# Patient Record
Sex: Male | Born: 1975 | Race: Black or African American | Hispanic: No | Marital: Single | Smoking: Current every day smoker
Health system: Southern US, Community
[De-identification: ages and names within clinical notes are randomized; demographics above are authoritative.]

## PROBLEM LIST (undated history)

## (undated) HISTORY — PX: OTHER SURGICAL HISTORY: SHX169

---

## 2007-01-22 ENCOUNTER — Emergency Department (HOSPITAL_COMMUNITY): Admission: EM | Admit: 2007-01-22 | Discharge: 2007-01-22 | Payer: Self-pay | Admitting: Emergency Medicine

## 2011-10-19 ENCOUNTER — Encounter (HOSPITAL_COMMUNITY): Payer: Self-pay | Admitting: *Deleted

## 2011-10-19 ENCOUNTER — Emergency Department (HOSPITAL_COMMUNITY): Payer: Self-pay

## 2011-10-19 ENCOUNTER — Emergency Department (HOSPITAL_COMMUNITY)
Admission: EM | Admit: 2011-10-19 | Discharge: 2011-10-19 | Disposition: A | Discharge status: Home Health | Payer: Self-pay | Attending: Emergency Medicine | Admitting: Emergency Medicine

## 2011-10-19 DIAGNOSIS — R509 Fever, unspecified: Secondary | ICD-10-CM

## 2011-10-19 DIAGNOSIS — B9789 Other viral agents as the cause of diseases classified elsewhere: Secondary | ICD-10-CM | POA: Insufficient documentation

## 2011-10-19 DIAGNOSIS — B349 Viral infection, unspecified: Secondary | ICD-10-CM

## 2011-10-19 DIAGNOSIS — R05 Cough: Secondary | ICD-10-CM

## 2011-10-19 DIAGNOSIS — R1110 Vomiting, unspecified: Secondary | ICD-10-CM

## 2011-10-19 DIAGNOSIS — R51 Headache: Secondary | ICD-10-CM

## 2011-10-19 DIAGNOSIS — R059 Cough, unspecified: Secondary | ICD-10-CM

## 2011-10-19 LAB — CBC
HCT: 44.5 % (ref 39.0–52.0)
MCV: 86.9 fL (ref 78.0–100.0)
RDW: 12.8 % (ref 11.5–15.5)
WBC: 4.8 10*3/uL (ref 4.0–10.5)

## 2011-10-19 LAB — COMPREHENSIVE METABOLIC PANEL
AST: 19 U/L (ref 0–37)
BUN: 10 mg/dL (ref 6–23)
CO2: 23 mEq/L (ref 19–32)
Calcium: 9.2 mg/dL (ref 8.4–10.5)
Creatinine, Ser: 0.99 mg/dL (ref 0.50–1.35)
GFR calc Af Amer: >90 mL/min (ref >90)
GFR calc non Af Amer: >90 mL/min (ref >90)
Glucose, Bld: 106 mg/dL — ABNORMAL HIGH (ref 70–99)

## 2011-10-19 LAB — URINALYSIS, ROUTINE W REFLEX MICROSCOPIC
Glucose, UA: 100 mg/dL — AB
Hgb urine dipstick: NEGATIVE
Ketones, ur: 15 mg/dL — AB
Protein, ur: 100 mg/dL — AB

## 2011-10-19 LAB — DIFFERENTIAL
Basophils Absolute: 0 10*3/uL (ref 0.0–0.1)
Eosinophils Relative: 0 % (ref 0–5)
Lymphocytes Relative: 15 % (ref 12–46)
Monocytes Absolute: 0.4 10*3/uL (ref 0.1–1.0)

## 2011-10-19 LAB — URINE MICROSCOPIC-ADD ON

## 2011-10-19 MED ORDER — ONDANSETRON HCL 4 MG PO TABS: 4.0000 mg | ORAL_TABLET | Freq: Four times a day (QID) | ORAL | Status: AC

## 2011-10-19 MED ORDER — SODIUM CHLORIDE 0.9 % IV BOLUS (SEPSIS)
1000.0000 mL | Freq: Once | INTRAVENOUS | Status: AC
  Administered 2011-10-19: 1000 mL via INTRAVENOUS

## 2011-10-19 MED ORDER — KETOROLAC TROMETHAMINE 30 MG/ML IJ SOLN
30.0000 mg | Freq: Once | INTRAMUSCULAR | Status: AC
  Administered 2011-10-19: 30 mg via INTRAVENOUS
  Filled 2011-10-19: qty 1

## 2011-10-19 MED ORDER — METOCLOPRAMIDE HCL 5 MG/ML IJ SOLN
10.0000 mg | Freq: Once | INTRAMUSCULAR | Status: AC
  Administered 2011-10-19: 10 mg via INTRAVENOUS
  Filled 2011-10-19: qty 2

## 2011-10-19 MED ORDER — ACETAMINOPHEN 325 MG PO TABS
650.0000 mg | ORAL_TABLET | Freq: Once | ORAL | Status: AC
  Administered 2011-10-19: 650 mg via ORAL
  Filled 2011-10-19: qty 2

## 2011-10-19 MED ORDER — DIPHENHYDRAMINE HCL 50 MG/ML IJ SOLN
25.0000 mg | Freq: Once | INTRAMUSCULAR | Status: AC
Start: 2011-10-19 — End: 2011-10-19
  Administered 2011-10-19: 25 mg via INTRAVENOUS
  Filled 2011-10-19: qty 1

## 2011-10-19 NOTE — ED Notes (Signed)
Pt given oral fluids per order. NAD noted at this time.

## 2011-10-19 NOTE — ED Notes (Signed)
Pt c/o headache for 7 days and gen flu sx for 4days. Pt States pain is in the back of head on left side. Pt has had n/v/d for 4 days and has not been keeping anything down. Pt states no medicine is helping his pain.

## 2011-10-19 NOTE — ED Notes (Signed)
Pt returned from xray

## 2011-10-19 NOTE — ED Notes (Signed)
Pt asleep and resting comfortably. 

## 2011-10-19 NOTE — ED Provider Notes (Signed)
History     CSN: [id number]  Arrival date & time 10/19/11  1544   First MD Initiated Contact with Patient 10/19/11 1731      Chief Complaint  Patient presents with  . Headache    (Consider location/radiation/quality/duration/timing/severity/associated sxs/prior treatment) HPI Patient presents with complaint of fever, vomiting and diarrhea as well as cough and low back pain. He has also had a headache associated with this illness. He denies any sore throat. He states he has been more tired than usual and has not been able to keep down oral medications. His temperature maximum at home was 103. He has tried multiple over-the-counter medications including Tylenol ibuprofen and cold medications without much relief. He denies any difficulty breathing. His emesis was nonbloody and nonbilious. There's been no blood in his stool. He has not had any episodes of fainting. He has no neck pain or stiffness. He's had no changes in his vision. There no associated systemic symptoms other than those already mentioned. There are no alleviating or modifying factors. He has no specific sick contacts. He's had no recent travel. History reviewed. No pertinent past medical history.  History reviewed. No pertinent past surgical history.  No family history on file.  History  Substance Use Topics  . Smoking status: Current Everyday Smoker  . Smokeless tobacco: Not on file  . Alcohol Use: Yes      Review of Systems ROS reviewed and otherwise negative except for mentioned in HPI  Allergies  Review of patient's allergies indicates no known allergies.  Home Medications   Current Outpatient Rx  Name Route Sig Dispense Refill  . ONDANSETRON HCL 4 MG PO TABS Oral Take 1 tablet (4 mg total) by mouth every 6 (six) hours. 12 tablet 0    BP 134/86  Pulse 98  Temp(Src) 101 F (38.3 C) (Oral)  Resp 20  SpO2 95% Vitals reviewed Physical Exam Physical Examination: General appearance - alert, well  appearing, and in no distress Mental status - alert, oriented to person, place, and time Neck- supple, no meningismus Eyes - pupils equal and reactive, extraocular eye movements intacts, no scleral icterus, no conjunctival injections Mouth - mucous membranes moist, pharynx normal without lesions Chest - clear to auscultation, no wheezes, rales or rhonchi, symmetric air entry Heart - normal rate, regular rhythm, normal S1, S2, no murmurs, rubs, clicks or gallops Abdomen - soft, nontender, nondistended, no masses or organomegaly Extremities - peripheral pulses normal, no pedal edema, no clubbing or cyanosis Skin - normal coloration and turgor, no rashes  ED Course  Procedures (including critical care time) 7:42 PM pt rechecked, he is feeling much improved, states he is hungry and thirsty and has more energy.  Headache is resolved as well.   Labs Reviewed  URINALYSIS, ROUTINE W REFLEX MICROSCOPIC - Abnormal; Notable for the following:    Glucose, UA 100 (*)    Ketones, ur 15 (*)    Protein, ur 100 (*)    All other components within normal limits  CBC - Abnormal; Notable for the following:    MCHC 36.4 (*)    All other components within normal limits  COMPREHENSIVE METABOLIC PANEL - Abnormal; Notable for the following:    Glucose, Bld 106 (*)    All other components within normal limits  URINE MICROSCOPIC-ADD ON - Abnormal; Notable for the following:    Squamous Epithelial / LPF FEW (*)    All other components within normal limits  DIFFERENTIAL  LAB REPORT - SCANNED  No results found.   1. Viral syndrome   2. Fever   3. Vomiting   4. Cough   5. Headache       MDM  Patient presenting with febrile illness including cough vomiting and diffuse body aches. In the emergency department he feels much improved after IV hydration and medication for headache and antibiotics. He is tolerating fluids without difficulty and actually states he feels much improved including feeling hungry.  I suspect a viral process. He had no meningismus to suggest meningitis. He was discharged with strict return precautions and he is agreeable with this plan.        Ethelda Chick, MD 10/24/11 [phone]

## 2011-10-19 NOTE — Discharge Instructions (Signed)
Return to the ED with any concerns including difficulty breathing, vomiting and not able to keep down liquids, decreased level of alertness or lethargy, or any other alarming symptoms.

## 2011-10-19 NOTE — ED Notes (Signed)
Pt d/c home in NAD. Pt voiced understanding of d/c instructions. Pt expressed gratitude to staff stating "I feel a lot better."

## 2011-10-19 NOTE — ED Notes (Signed)
Pt in xray

## 2011-10-19 NOTE — ED Notes (Signed)
The pt has been ill; for 7 days.  Headache elevated temp  Vomiting  Lower back pain.  His headache is no t letting up

## 2011-10-19 NOTE — ED Notes (Addendum)
Pt tolerating oral fluids and requesting and given another cup.

## 2012-05-28 ENCOUNTER — Encounter (HOSPITAL_COMMUNITY): Payer: Self-pay | Admitting: *Deleted

## 2012-05-28 ENCOUNTER — Emergency Department (HOSPITAL_COMMUNITY)
Admission: EM | Admit: 2012-05-28 | Discharge: 2012-05-29 | Disposition: A | Discharge status: Home / Self Care | Payer: Self-pay | Attending: Emergency Medicine | Admitting: Emergency Medicine

## 2012-05-28 DIAGNOSIS — IMO0002 Reserved for concepts with insufficient information to code with codable children: Secondary | ICD-10-CM

## 2012-05-28 DIAGNOSIS — F17200 Nicotine dependence, unspecified, uncomplicated: Secondary | ICD-10-CM | POA: Insufficient documentation

## 2012-05-28 DIAGNOSIS — S0180XA Unspecified open wound of other part of head, initial encounter: Secondary | ICD-10-CM | POA: Insufficient documentation

## 2012-05-28 DIAGNOSIS — W2203XA Walked into furniture, initial encounter: Secondary | ICD-10-CM | POA: Insufficient documentation

## 2012-05-28 NOTE — ED Notes (Signed)
Pt ambulatory to [facility], conscious alert and oriented. Pt sts he was moving furniture earlier today when an armoire fell onto his face. No LOC.

## 2012-05-29 MED ORDER — TETANUS-DIPHTH-ACELL PERTUSSIS 5-2.5-18.5 LF-MCG/0.5 IM SUSP
0.5000 mL | Freq: Once | INTRAMUSCULAR | Status: AC
  Administered 2012-05-29: 0.5 mL via INTRAMUSCULAR
  Filled 2012-05-29: qty 0.5

## 2012-05-29 NOTE — ED Provider Notes (Signed)
History     CSN: [id number]  Arrival date & time 05/28/12  2241   First MD Initiated Contact with Patient 05/28/12 2319      Chief Complaint  Patient presents with  . Facial Laceration    (Consider location/radiation/quality/duration/timing/severity/associated sxs/prior treatment) HPI Comments: Patient presents s/p injury to the head while moving furniture. Patient states that he was moving a couch when it slipped and caught the left corner of his head, resulting in a laceration. Patient states that there was a significant amount of bleeding. Unsure of last tetanus. Denies headache or LOC. Denies other injuries.   The history is provided by the patient. No language interpreter was used.    History reviewed. No pertinent past medical history.  Past Surgical History  Procedure Date  . No previous surgery     No family history on file.  History  Substance Use Topics  . Smoking status: Current Every Day Smoker  . Smokeless tobacco: Not on file  . Alcohol Use: Yes      Review of Systems  Skin: Positive for wound.  Neurological: Negative for headaches.    Allergies  Review of patient's allergies indicates no known allergies.  Home Medications  No current outpatient prescriptions on file.  BP 140/83  Pulse 60  Temp 98.6 F (37 C) (Oral)  Resp 16  Ht 5' 11.5" (1.816 m)  Wt 220 lb (99.791 kg)  BMI 30.26 kg/m2  SpO2 99%  Physical Exam  Nursing note and vitals reviewed. Constitutional: He appears well-developed and well-nourished. No distress.  HENT:  Head: Normocephalic.  Mouth/Throat: Oropharynx is clear and moist.  Eyes: Conjunctivae normal and EOM are normal.  Neck: Normal range of motion. Neck supple.  Cardiovascular: Normal rate, regular rhythm and normal heart sounds.   Pulmonary/Chest: Effort normal and breath sounds normal.  Abdominal: Soft. Bowel sounds are normal. There is no tenderness.  Neurological: He is alert.  Skin: Skin is warm and dry.         ED Course  Procedures (including critical care time)  Labs Reviewed - No data to display No results found.  LACERATION REPAIR Performed by: Pixie Casino Authorized by: Pixie Casino Consent: Verbal consent obtained. Risks and benefits: risks, benefits and alternatives were discussed Consent given by: patient Patient identity confirmed: provided demographic data Prepped and Draped in normal sterile fashion Wound explored  Laceration Location: above left eyebrow  Laceration Length: 1.5cm  No Foreign Bodies seen or palpated  Anesthesia: local infiltration  Local anesthetic: lidocaine 1% 2 epinephrine  Anesthetic total: 4ml  Irrigation method: syringe Amount of cleaning: standard  Skin closure: well approximated  Number of sutures: 4  Technique: simple interrupted  Patient tolerance: Patient tolerated the procedure well with no immediate complications.  1. Laceration       MDM  Patient presented with laceration above the left eyebrow while moving furniture. Laceration repaired without complication. Tetanus updated. Patient discharged with return precautions.        Pixie Casino, PA-C 05/29/12 1734

## 2012-05-29 NOTE — ED Notes (Signed)
Patient given discharge instructions, information, prescriptions, and diet order. Patient states that they adequately understand discharge information given and to return to ED if symptoms return or worsen.     

## 2012-06-01 NOTE — ED Provider Notes (Signed)
Medical screening examination/treatment/procedure(s) were performed by non-physician practitioner and as supervising physician I was immediately available for consultation/collaboration.  Jese Comella T Sue Mcalexander, MD 06/01/12 1021 

## 2012-06-03 ENCOUNTER — Emergency Department (HOSPITAL_COMMUNITY)
Admission: EM | Admit: 2012-06-03 | Discharge: 2012-06-03 | Disposition: A | Discharge status: Home / Self Care | Payer: Self-pay | Attending: Emergency Medicine | Admitting: Emergency Medicine

## 2012-06-03 ENCOUNTER — Encounter (HOSPITAL_COMMUNITY): Payer: Self-pay

## 2012-06-03 DIAGNOSIS — Z4802 Encounter for removal of sutures: Secondary | ICD-10-CM | POA: Insufficient documentation

## 2012-06-03 DIAGNOSIS — F17200 Nicotine dependence, unspecified, uncomplicated: Secondary | ICD-10-CM | POA: Insufficient documentation

## 2012-06-03 NOTE — ED Provider Notes (Signed)
History     CSN: [id number]  Arrival date & time 06/03/12  1152   First MD Initiated Contact with Patient 06/03/12 1241      Chief Complaint  Patient presents with  . Suture / Staple Removal    (Consider location/radiation/quality/duration/timing/severity/associated sxs/prior treatment) HPI  36 year old male presents for suture removal. Suture were placed on his left forehead above the eyebrow 6 days ago when he suffered a laceration from falling furniture.  Denies increasing pain, drainage, or swelling.  No other complaint  History reviewed. No pertinent past medical history.  Past Surgical History  Procedure Date  . No previous surgery     No family history on file.  History  Substance Use Topics  . Smoking status: Current Every Day Smoker  . Smokeless tobacco: Not on file  . Alcohol Use: Yes      Review of Systems  Constitutional: Negative for fever.  Skin: Positive for wound. Negative for rash.    Allergies  Review of patient's allergies indicates no known allergies.  Home Medications  No current outpatient prescriptions on file.  BP 136/72  Pulse 90  Temp 98 F (36.7 C) (Oral)  Resp 16  Ht 5\' 11"  (1.803 m)  Wt 220 lb (99.791 kg)  BMI 30.68 kg/m2  SpO2 100%  Physical Exam  Nursing note and vitals reviewed. Constitutional: He appears well-developed and well-nourished. No distress.  HENT:  Head: Normocephalic.       Laceration noted to L forehead above L eyebrow, no signs of infection, poor closure.  Eyes: Conjunctivae normal are normal.  Neck: Normal range of motion. Neck supple.  Neurological: He is alert.  Skin: Skin is warm. No rash noted.  Psychiatric: He has a normal mood and affect.    ED Course  Procedures (including critical care time)  Labs Reviewed - No data to display No results found.   No diagnosis found.  SUTURE REMOVAL Performed by: Fayrene Helper  Consent: Verbal consent obtained. Patient identity confirmed: provided  demographic data Time out: Immediately prior to procedure a "time out" was called to verify the correct patient, procedure, equipment, support staff and site/side marked as required.  Location details: L forehead  Wound Appearance: clean, poor closure  Sutures/Staples Removed: sutures  Facility: sutures placed in this facility Patient tolerance: Patient tolerated the procedure well with no immediate complications.   1. Visit for suture removal.   MDM  Pt here for sutures removal on L forehead. No signs of infection.  Wound is inadequately closed due to poor approximation of wound edge, causing epidermis to invert into wound.  Therefore, sterile tape were applied.  Care instruction given.     BP 136/72  Pulse 90  Temp 98 F (36.7 C) (Oral)  Resp 16  Ht 5\' 11"  (1.803 m)  Wt 220 lb (99.791 kg)  BMI 30.68 kg/m2  SpO2 100%      Fayrene Helper, PA-C 06/03/12 1307

## 2012-06-03 NOTE — ED Notes (Signed)
Pt presents with NAD_ here for removal of sutures above left eyebrow- no s/s of infection

## 2012-06-04 NOTE — ED Provider Notes (Signed)
Medical screening examination/treatment/procedure(s) were performed by non-physician practitioner and as supervising physician I was immediately available for consultation/collaboration.   Nature Kueker M Skyylar Kopf, DO 06/04/12 2028 

## 2013-03-31 IMAGING — CR DG CHEST 2V
2 series · 2 of 2 positions shown · non-contrast
Comparison: None.

CLINICAL DATA: Fever and cough.

CHEST - 2 VIEW

[w chest pa]
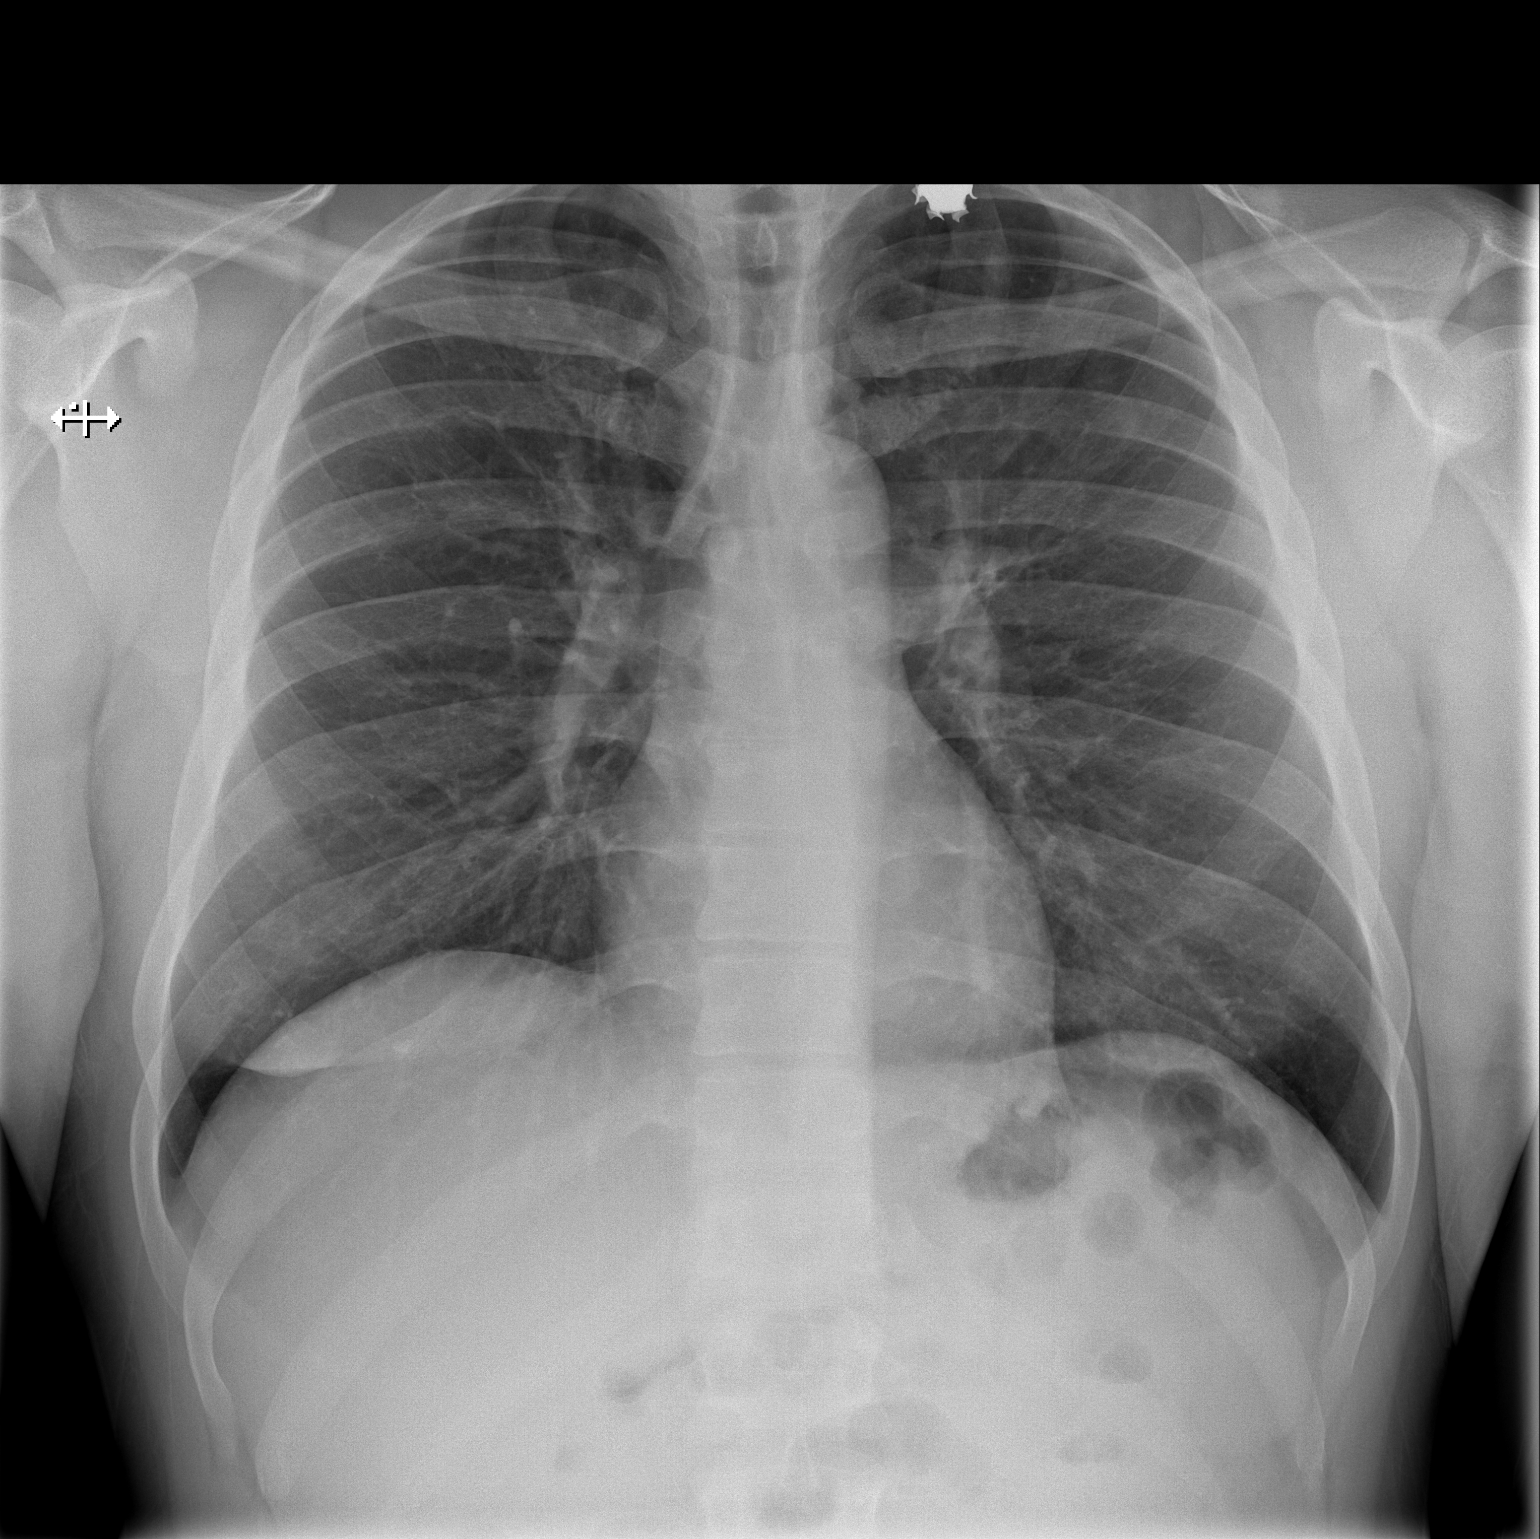

[w chest lat]
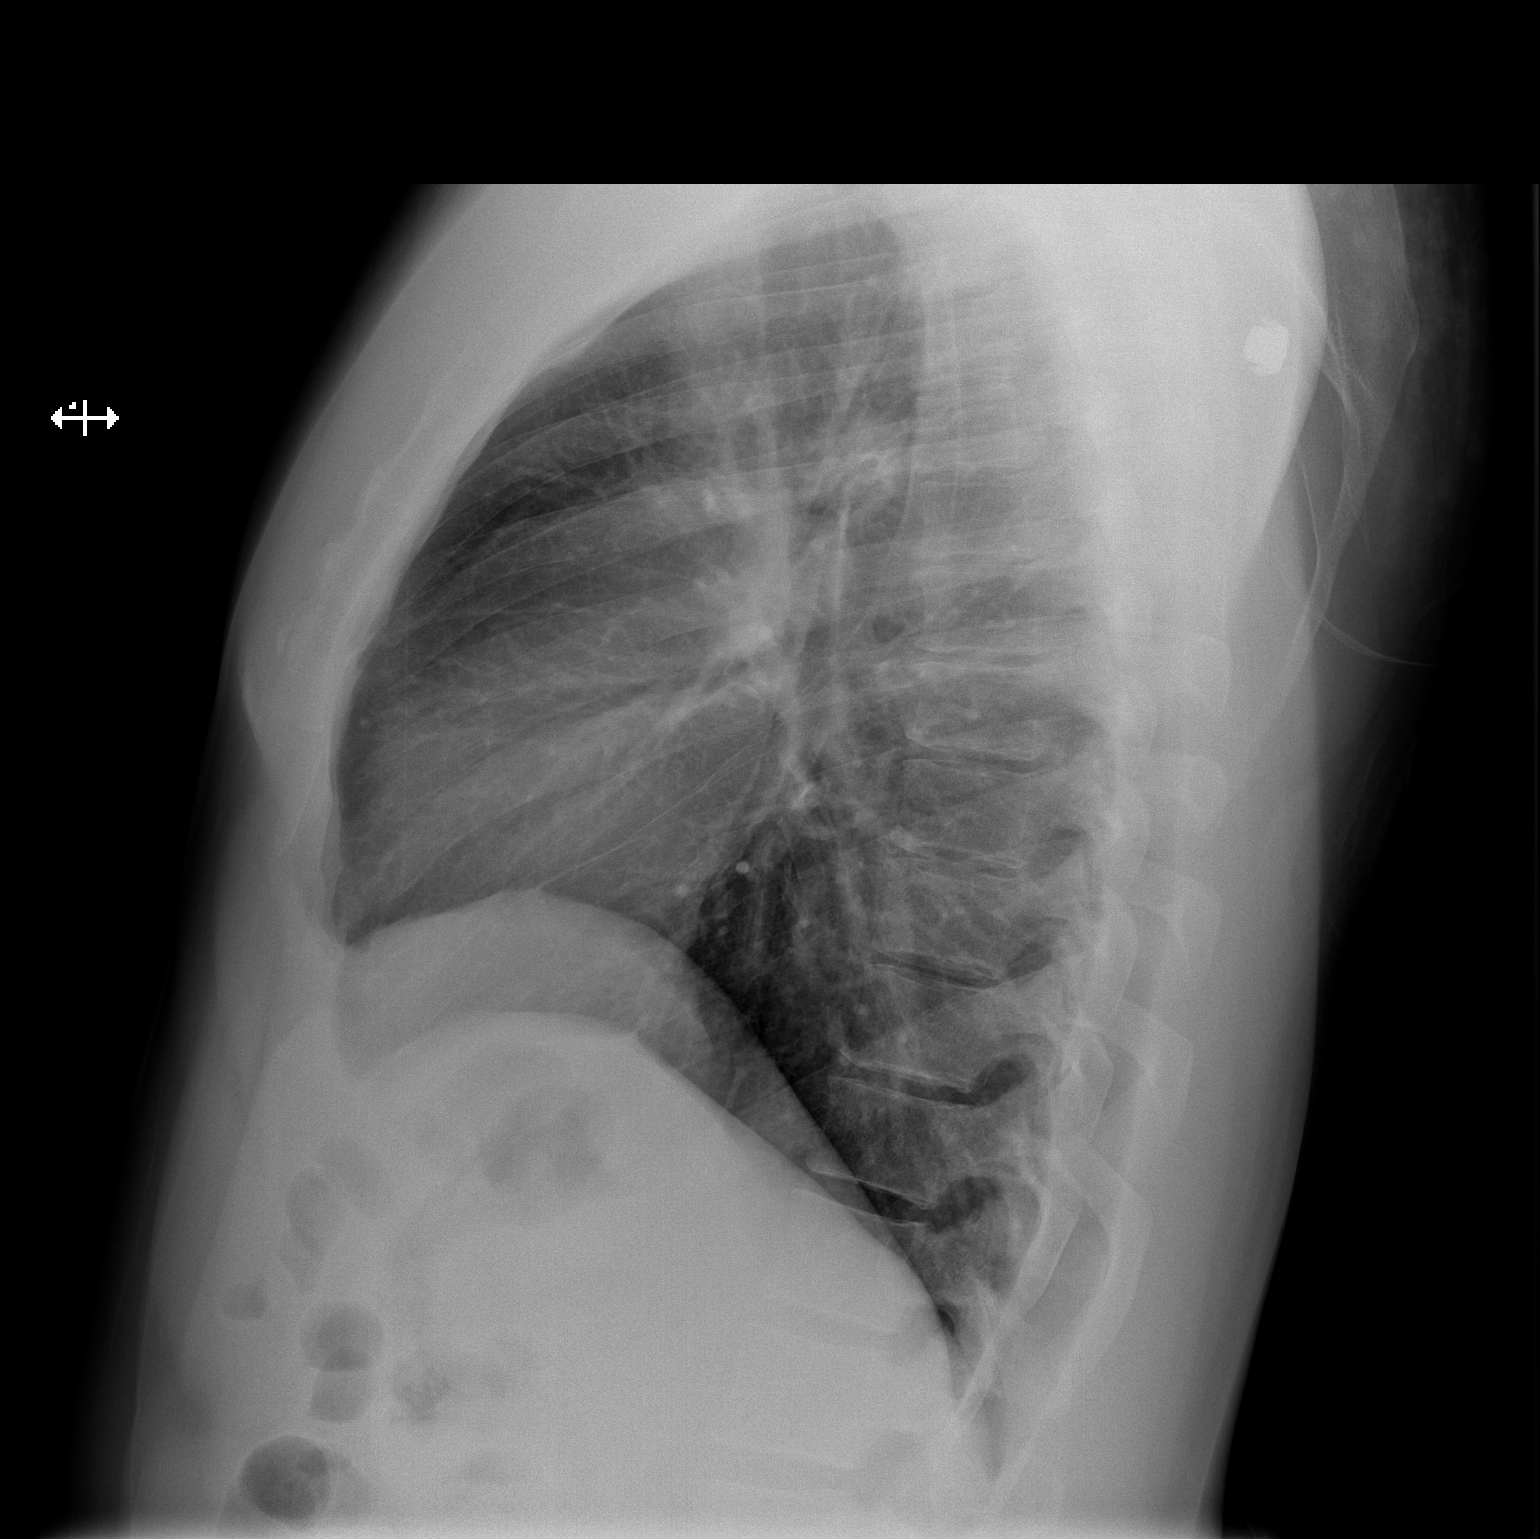

[2 of 2 positions shown; findings below may reference images not displayed]

FINDINGS: The lungs are clear without focal consolidation, edema,
effusion or pneumothorax.  Cardiopericardial silhouette is within
normal limits for size.  Imaged bony structures of the thorax are
intact.
IMPRESSION: Normal exam.

## 2013-11-24 ENCOUNTER — Encounter (HOSPITAL_COMMUNITY): Payer: Self-pay | Admitting: Emergency Medicine

## 2013-11-24 ENCOUNTER — Emergency Department (HOSPITAL_COMMUNITY)
Admission: EM | Admit: 2013-11-24 | Discharge: 2013-11-24 | Disposition: A | Discharge status: Home / Self Care | Payer: Self-pay | Attending: Emergency Medicine | Admitting: Emergency Medicine

## 2013-11-24 DIAGNOSIS — K0820 Unspecified atrophy of edentulous alveolar ridge: Secondary | ICD-10-CM | POA: Insufficient documentation

## 2013-11-24 DIAGNOSIS — F17200 Nicotine dependence, unspecified, uncomplicated: Secondary | ICD-10-CM | POA: Insufficient documentation

## 2013-11-24 DIAGNOSIS — K0889 Other specified disorders of teeth and supporting structures: Secondary | ICD-10-CM

## 2013-11-24 DIAGNOSIS — K029 Dental caries, unspecified: Secondary | ICD-10-CM | POA: Insufficient documentation

## 2013-11-24 DIAGNOSIS — R51 Headache: Secondary | ICD-10-CM | POA: Insufficient documentation

## 2013-11-24 DIAGNOSIS — K089 Disorder of teeth and supporting structures, unspecified: Secondary | ICD-10-CM | POA: Insufficient documentation

## 2013-11-24 MED ORDER — IBUPROFEN 800 MG PO TABS: 800.0000 mg | ORAL_TABLET | Freq: Three times a day (TID) | ORAL | Status: DC

## 2013-11-24 MED ORDER — HYDROCODONE-ACETAMINOPHEN 5-325 MG PO TABS: 1.0000 | ORAL_TABLET | ORAL | Status: DC | PRN

## 2013-11-24 MED ORDER — AMOXICILLIN 500 MG PO CAPS: 500.0000 mg | ORAL_CAPSULE | Freq: Three times a day (TID) | ORAL | Status: DC

## 2013-11-24 MED ORDER — OXYCODONE-ACETAMINOPHEN 5-325 MG PO TABS
1.0000 | ORAL_TABLET | Freq: Once | ORAL | Status: AC
  Administered 2013-11-24: 1 via ORAL
  Filled 2013-11-24: qty 1

## 2013-11-24 MED ORDER — IBUPROFEN 800 MG PO TABS
800.0000 mg | ORAL_TABLET | Freq: Once | ORAL | Status: AC
  Administered 2013-11-24: 800 mg via ORAL
  Filled 2013-11-24: qty 1

## 2013-11-24 NOTE — Progress Notes (Signed)
[facility] provided pt with dental resources, list of primary care resources, and a [facility] application to help patient establish primary care.

## 2013-11-24 NOTE — Discharge Instructions (Signed)
Ibuprofen for pain. norco for severe pain. Take amoxil until all gone for possible infection. Follow up with a dentist.    Dental Caries  Dental caries (also called tooth decay) is the most common oral disease. It can occur at any age, but is more common in children and young adults.  HOW DENTAL CARIES DEVELOPS  The process of decay begins when bacteria and foods (particularly sugars and starches) combine in your mouth to produce plaque. Plaque is a substance that sticks to the hard, outer surface of a tooth (enamel). The bacteria in plaque produce acids that attack enamel. These acids may also attack the root surface of a tooth (cementum) if it is exposed. Repeated attacks dissolve these surfaces and create holes in the tooth (cavities). If left untreated, the acids destroy the other layers of the tooth.  RISK FACTORS  Frequent sipping of sugary beverages.   Frequent snacking on sugary and starchy foods, especially those that easily get stuck in the teeth.   Poor oral hygiene.   Dry mouth.   Substance abuse such as methamphetamine abuse.   Broken or poor-fitting dental restorations.   Eating disorders.   Gastroesophageal reflux disease (GERD).   Certain radiation treatments to the head and neck. SYMPTOMS In the early stages of dental caries, symptoms are seldom present. Sometimes white, chalky areas may be seen on the enamel or other tooth layers. In later stages, symptoms may include:  Pits and holes on the enamel.  Toothache after sweet, hot, or cold foods or drinks are consumed.  Pain around the tooth.  Swelling around the tooth. DIAGNOSIS  Most of the time, dental caries is detected during a regular dental checkup. A diagnosis is made after a thorough medical and dental history is taken and the surfaces of your teeth are checked for signs of dental caries. Sometimes special instruments, such as lasers, are used to check for dental caries. Dental X-ray exams may be  taken so that areas not visible to the eye (such as between the contact areas of the teeth) can be checked for cavities.  TREATMENT  If dental caries is in its early stages, it may be reversed with a fluoride treatment or an application of a remineralizing agent at the dental office. Thorough brushing and flossing at home is needed to aid these treatments. If it is in its later stages, treatment depends on the location and extent of tooth destruction:   If a small area of the tooth has been destroyed, the destroyed area will be removed and cavities will be filled with a material such as gold, silver amalgam, or composite resin.   If a large area of the tooth has been destroyed, the destroyed area will be removed and a cap (crown) will be fitted over the remaining tooth structure.   If the center part of the tooth (pulp) is affected, a procedure called a root canal will be needed before a filling or crown can be placed.   If most of the tooth has been destroyed, the tooth may need to be pulled (extracted). HOME CARE INSTRUCTIONS You can prevent, stop, or reverse dental caries at home by practicing good oral hygiene. Good oral hygiene includes:  Thoroughly cleaning your teeth at least twice a day with a toothbrush and dental floss.   Using a fluoride toothpaste. A fluoride mouth rinse may also be used if recommended by your dentist or health care provider.   Restricting the amount of sugary and starchy foods and  sugary liquids you consume.   Avoiding frequent snacking on these foods and sipping of these liquids.   Keeping regular visits with a dentist for checkups and cleanings. PREVENTION   Practice good oral hygiene.  Consider a dental sealant. A dental sealant is a coating material that is applied by your dentist to the pits and grooves of teeth. The sealant prevents food from being trapped in them. It may protect the teeth for several years.  Ask about fluoride supplements if  you live in a community without fluorinated water or with water that has a low fluoride content. Use fluoride supplements as directed by your dentist or health care provider.  Allow fluoride varnish applications to teeth if directed by your dentist or health care provider. Document Released: 04/21/2002 Document Revised: 04/01/2013 Document Reviewed: 08/01/2012 [facility] Patient Information 2014 [employer] [place].

## 2013-11-24 NOTE — ED Notes (Signed)
Pt reports lower back right tooth pain with 2 broken teeth. Pt reports pain so bad "I could not sleep last night". Pain started yesterday and is 10/10.

## 2013-11-24 NOTE — ED Provider Notes (Signed)
CSN: [id number]     Arrival date & time 11/24/13  1042 History   First MD Initiated Contact with Patient 11/24/13 1049     No chief complaint on file.    (Consider location/radiation/quality/duration/timing/severity/associated sxs/prior Treatment) HPI Antonio Salas is a 38 y.o. male who presents to emergency department complaining of a toothache. Patient states that he has pain in his right lower first molar tooth. States pain radiates all over right jaw. He denies any facial swelling or swelling under the tongue. He denies any gum swelling. He states he is unable to eat. States he did not sleep last month because the pain. He did not take any medications for this. States he has a history of carries in that tooth, and it is broken off. Patient with prior upper teeth work and a partial, [place] States needs to have his lower teeth fixed now. He does not have dental insurance and does not have a dentist at this time. He denies any fever chills.  History reviewed. No pertinent past medical history. Past Surgical History  Procedure Laterality Date  . No previous surgery     No family history on file. History  Substance Use Topics  . Smoking status: Current Every Day Smoker  . Smokeless tobacco: Not on file  . Alcohol Use: Yes    Review of Systems  Constitutional: Negative for fever and chills.  HENT: Positive for dental problem. Negative for facial swelling.   Respiratory: Negative for cough, chest tightness and shortness of breath.   Cardiovascular: Negative for chest pain, palpitations and leg swelling.  Skin: Negative for rash.  Allergic/Immunologic: Negative for immunocompromised state.  Neurological: Positive for headaches. Negative for dizziness, weakness, light-headedness and numbness.      Allergies  Review of patient's allergies indicates no known allergies.  Home Medications   Prior to Admission medications   Not on File   BP 149/92  Pulse 79   Temp(Src) 98.6 F (37 C) (Oral)  Resp 16  SpO2 99% Physical Exam  Nursing note and vitals reviewed. Constitutional: He appears well-developed and well-nourished. No distress.  HENT:  Head: Normocephalic and atraumatic.  Edentulous upper front, partial and place, he removed partial, and gums appear normal. Right lower first molar with large cavity. No surrounding gum erythema or swelling. Tooth is tender to palpation. There is also a very large cavity in the left upper second molar. No signs of infection.  Eyes: Conjunctivae are normal.  Neck: Neck supple.  Cardiovascular: Normal rate, regular rhythm and normal heart sounds.   Pulmonary/Chest: Effort normal. No respiratory distress. He has no wheezes. He has no rales.  Musculoskeletal: He exhibits no edema.  Neurological: He is alert.  Skin: Skin is warm and dry.    ED Course  Procedures (including critical care time) Labs Review Labs Reviewed - No data to display  Imaging Review No results found.   EKG Interpretation None      MDM   Final diagnoses:  Dental cavity  Pain, dental    Patient with dental caries. No signs of obvious infection at this time. Will start on amoxicillin for possible early infection. Discussed need for followup with a dentist. Patient states he only went to a free clinic, and was told he needed to apply for orange card which he will do today. I also gave him flyer for a free clinic. Will give ibuprofen and Percocet for pain. Pt is non toxic otherwise, no signs of infection  or ludwig's angina.   Filed Vitals:   11/24/13 1051  BP: 149/92  Pulse: 79  Temp: 98.6 F (37 C)  Resp: 16      Eliyas Suddreth A Elese Rane, PA-C 11/24/13 1353

## 2013-11-27 NOTE — ED Provider Notes (Signed)
Medical screening examination/treatment/procedure(s) were performed by non-physician practitioner and as supervising physician I was immediately available for consultation/collaboration.   EKG Interpretation None        Kameran Lallier J. Urijah Raynor, MD 11/27/13 1701 

## 2014-10-10 ENCOUNTER — Emergency Department (HOSPITAL_COMMUNITY)
Admission: EM | Admit: 2014-10-10 | Discharge: 2014-10-11 | Disposition: A | Discharge status: Home / Self Care | Payer: Self-pay | Attending: Emergency Medicine | Admitting: Emergency Medicine

## 2014-10-10 ENCOUNTER — Encounter (HOSPITAL_COMMUNITY): Payer: Self-pay | Admitting: Emergency Medicine

## 2014-10-10 DIAGNOSIS — K644 Residual hemorrhoidal skin tags: Secondary | ICD-10-CM | POA: Insufficient documentation

## 2014-10-10 DIAGNOSIS — Z792 Long term (current) use of antibiotics: Secondary | ICD-10-CM | POA: Insufficient documentation

## 2014-10-10 DIAGNOSIS — K648 Other hemorrhoids: Secondary | ICD-10-CM | POA: Insufficient documentation

## 2014-10-10 DIAGNOSIS — Z720 Tobacco use: Secondary | ICD-10-CM | POA: Insufficient documentation

## 2014-10-10 DIAGNOSIS — Z791 Long term (current) use of non-steroidal anti-inflammatories (NSAID): Secondary | ICD-10-CM | POA: Insufficient documentation

## 2014-10-10 NOTE — ED Notes (Signed)
Pt states that he has had a 'knot' on his rectum for several days and noticed pain today. Some blood noted when wiping. Alert and oriented. Hx of hemmrrhoids.

## 2014-10-11 MED ORDER — HYDROCORTISONE ACE-PRAMOXINE 1-1 % RE FOAM: 1.0000 | Freq: Two times a day (BID) | RECTAL | Status: AC

## 2014-10-11 MED ORDER — POLYETHYLENE GLYCOL 3350 17 G PO PACK: 17.0000 g | PACK | Freq: Every day | ORAL | Status: AC

## 2014-10-11 NOTE — Discharge Instructions (Signed)

## 2014-10-11 NOTE — ED Provider Notes (Signed)
CSN: [id number]     Arrival date & time 10/10/14  2303 History   First MD Initiated Contact with Patient 10/11/14 0116     Chief Complaint  Patient presents with  . Rectal Pain     (Consider location/radiation/quality/duration/timing/severity/associated sxs/prior Treatment) HPI 39 year old male presents to the emergency department with complaint of pain in his rectum.  Patient has a history of hemorrhoids.  He noticed today that he was having more pain and some blood with wiping.  When applying Preparation H, patient noted a knot to his rectum.  No prior history of same.  No fevers or chills. History reviewed. No pertinent past medical history. Past Surgical History  Procedure Laterality Date  . No previous surgery     History reviewed. No pertinent family history. History  Substance Use Topics  . Smoking status: Current Every Day Smoker  . Smokeless tobacco: Not on file  . Alcohol Use: Yes    Review of Systems  See History of Present Illness; otherwise all other systems are reviewed and negative   Allergies  Review of patient's allergies indicates no known allergies.  Home Medications   Prior to Admission medications   Medication Sig Start Date End Date Taking? Authorizing Provider  phenylephrine-shark liver oil-mineral oil-petrolatum (PREPARATION H) 0.25-3-14-71.9 % rectal ointment Place 1 application rectally 2 (two) times daily as needed for hemorrhoids.   Yes Historical Provider, MD  amoxicillin (AMOXIL) 500 MG capsule Take 1 capsule (500 mg total) by mouth 3 (three) times daily. Patient not taking: Reported on 10/11/2014 11/24/13   Tatyana A Kirichenko, PA-C  HYDROcodone-acetaminophen (NORCO/VICODIN) 5-325 MG per tablet Take 1 tablet by mouth every 4 (four) hours as needed. Patient not taking: Reported on 10/11/2014 11/24/13   Tatyana A Kirichenko, PA-C  ibuprofen (ADVIL,MOTRIN) 800 MG tablet Take 1 tablet (800 mg total) by mouth 3 (three) times daily. Patient not taking:  Reported on 10/11/2014 11/24/13   Tatyana A Kirichenko, PA-C   BP 144/79 mmHg  Pulse 86  Temp(Src) 98 F (36.7 C) (Oral)  SpO2 98% Physical Exam  Constitutional: He is oriented to person, place, and time. He appears well-developed and well-nourished. No distress.  Abdominal: Soft. Bowel sounds are normal. He exhibits no distension and no mass. There is no tenderness. There is no rebound and no guarding.  Genitourinary:  Patient with both internal and external hemorrhoids that are swollen but not engorged.  They are tender to touch.  Both at 5:00 position.  They are not thrombosed.  Do not require I&D at this time  Musculoskeletal: Normal range of motion. He exhibits no edema or tenderness.  Neurological: He is alert and oriented to person, place, and time.  Skin: Skin is warm and dry. No rash noted. No erythema. No pallor.  Psychiatric: He has a normal mood and affect. His behavior is normal. Judgment and thought content normal.  Nursing note and vitals reviewed.   ED Course  Procedures (including critical care time) Labs Review Labs Reviewed - No data to display  Imaging Review No results found.   EKG Interpretation None      MDM   Final diagnoses:  Internal hemorrhoid  External hemorrhoid    39 year old male with internal and external hemorrhoids.  Plan for stool softener, proctofoam, follow-up with primary care doctor    Olivia Mackielga M Marqus Macphee, MD 10/11/14 0202

## 2015-10-01 ENCOUNTER — Encounter (HOSPITAL_COMMUNITY): Payer: Self-pay

## 2015-10-01 ENCOUNTER — Emergency Department (HOSPITAL_COMMUNITY)
Admission: EM | Admit: 2015-10-01 | Discharge: 2015-10-01 | Disposition: A | Discharge status: Home / Self Care | Payer: Self-pay | Attending: Emergency Medicine | Admitting: Emergency Medicine

## 2015-10-01 DIAGNOSIS — F17200 Nicotine dependence, unspecified, uncomplicated: Secondary | ICD-10-CM | POA: Insufficient documentation

## 2015-10-01 DIAGNOSIS — Z7952 Long term (current) use of systemic steroids: Secondary | ICD-10-CM | POA: Insufficient documentation

## 2015-10-01 DIAGNOSIS — L03011 Cellulitis of right finger: Secondary | ICD-10-CM | POA: Insufficient documentation

## 2015-10-01 MED ORDER — CEPHALEXIN 500 MG PO CAPS: 500.0000 mg | ORAL_CAPSULE | Freq: Four times a day (QID) | ORAL | Status: AC

## 2015-10-01 MED ORDER — ACETAMINOPHEN 325 MG PO TABS
650.0000 mg | ORAL_TABLET | Freq: Once | ORAL | Status: AC
  Administered 2015-10-01: 650 mg via ORAL
  Filled 2015-10-01: qty 2

## 2015-10-01 MED ORDER — HYDROCODONE-ACETAMINOPHEN 5-325 MG PO TABS: 1.0000 | ORAL_TABLET | Freq: Four times a day (QID) | ORAL | Status: AC | PRN

## 2015-10-01 MED ORDER — SULFAMETHOXAZOLE-TRIMETHOPRIM 800-160 MG PO TABS: 1.0000 | ORAL_TABLET | Freq: Two times a day (BID) | ORAL | Status: AC

## 2015-10-01 NOTE — Discharge Instructions (Signed)
Soak finger several times a day. Take both antibiotics as prescribed. Follow up as needed. Return if worsening.    Paronychia Paronychia is an infection of the skin that surrounds a nail. It usually affects the skin around a fingernail, but it may also occur near a toenail. It often causes pain and swelling around the nail. This condition may come on suddenly or develop over a longer period. In some cases, a collection of pus (abscess) can form near or under the nail. Usually, paronychia is not serious and it clears up with treatment. CAUSES This condition may be caused by bacteria or fungi. It is commonly caused by either Streptococcus or Staphylococcus bacteria. The bacteria or fungi often cause the infection by getting into the affected area through an opening in the skin, such as a cut or a hangnail. RISK FACTORS This condition is more likely to develop in:  People who get their hands wet often, such as those who work as Fish farm manager, bartenders, or nurses.  People who bite their fingernails or suck their thumbs.  People who trim their nails too short.  People who have hangnails or injured fingertips.  People who get manicures.  People who have diabetes. SYMPTOMS Symptoms of this condition include:  Redness and swelling of the skin near the nail.  Tenderness around the nail when you touch the area.  Pus-filled bumps under the cuticle. The cuticle is the skin at the base or sides of the nail.  Fluid or pus under the nail.  Throbbing pain in the area. DIAGNOSIS This condition is usually diagnosed with a physical exam. In some cases, a sample of pus may be taken from an abscess to be tested in a lab. This can help to determine what type of bacteria or fungi is causing the condition. TREATMENT Treatment for this condition depends on the cause and severity of the condition. If the condition is mild, it may clear up on its own in a few days. Your health care provider may recommend  soaking the affected area in warm water a few times a day. When treatment is needed, the options may include:  Antibiotic medicine, if the condition is caused by a bacterial infection.  Antifungal medicine, if the condition is caused by a fungal infection.  Incision and drainage, if an abscess is present. In this procedure, the health care provider will cut open the abscess so the pus can drain out. HOME CARE INSTRUCTIONS  Soak the affected area in warm water if directed to do so by your health care provider. You may be told to do this for 20 minutes, 2-3 times a day. Keep the area dry in between soakings.  Take medicines only as directed by your health care provider.  If you were prescribed an antibiotic medicine, finish all of it even if you start to feel better.  Keep the affected area clean.  Do not try to drain a fluid-filled bump yourself.  If you will be washing dishes or performing other tasks that require your hands to get wet, wear rubber gloves. You should also wear gloves if your hands might come in contact with irritating substances, such as cleaners or chemicals.  Follow your health care provider's instructions about:  Wound care.  Bandage (dressing) changes and removal. SEEK MEDICAL CARE IF:  Your symptoms get worse or do not improve with treatment.  You have a fever or chills.  You have redness spreading from the affected area.  You have continued or increased  fluid, blood, or pus coming from the affected area.  Your finger or knuckle becomes swollen or is difficult to move.   This information is not intended to replace advice given to you by your health care provider. Make sure you discuss any questions you have with your health care provider.   Document Released: 01/23/2001 Document Revised: 12/14/2014 Document Reviewed: 07/07/2014 Elsevier Interactive Patient Education [employer]

## 2015-10-01 NOTE — ED Notes (Signed)
Pt presents with c/o right middle finger pain and swelling that he noticed approx 3 days ago. Pt reports he has noticed some pus draining from the finger, reports that yesterday he took a safety pin, heated it up and tried to drain some of the pus.

## 2015-10-01 NOTE — ED Provider Notes (Signed)
CSN: [id number]     Arrival date & time 10/01/15  1426 History  By signing my name below, I, Antonio Salas, attest that this documentation has been prepared under the direction and in the presence of non-physician practitioner, Antonio Crumble, PA-C. Electronically Signed: Linna Salas, Scribe. 10/01/2015. 2:34 PM.    Chief Complaint  Patient presents with  . Hand Pain    The history is provided by the patient. No language interpreter was used.     HPI Comments: Antonio Salas is a 40 y.o. male with no pertinent PMHx who presents to the Emergency Department complaining of sudden onset, constant, throbbing, right middle finger pain beginning approximately three days ago. He endorses associated right middle finger swelling and reports that the pain and swelling are localized around his right middle fingernail. He states that he bites his fingernails and may have bitten his right middle fingernail back too far. Pt reports that he woke up three days ago with throbbing pain in his finger and took ibuprofen with no relief. He reports that he noticed a green color around his fingernail the following day and took more ibuprofen without relief. Pt states that he punctured the swollen area with a safety pin yesterday and experienced significant pus-like drainage. He also reports that he shook hands yesterday and experienced pus-like drainage. He notes that his finger has not gotten worse since yesterday. Pt denies numbness, weakness, or any other associated symptoms at this time.    History reviewed. No pertinent past medical history. Past Surgical History  Procedure Laterality Date  . No previous surgery     No family history on file. Social History  Substance Use Topics  . Smoking status: Current Every Day Smoker  . Smokeless tobacco: None  . Alcohol Use: Yes    Review of Systems  Musculoskeletal: Positive for joint swelling (right middle finger) and arthralgias (right middle finger).   Neurological: Negative for weakness and numbness.      Allergies  Review of patient's allergies indicates no known allergies.  Home Medications   Prior to Admission medications   Medication Sig Start Date End Date Taking? Authorizing Provider  hydrocortisone-pramoxine (PROCTOFOAM HC) rectal foam Place 1 applicator rectally 2 (two) times daily. 10/11/14   Antonio Severin, MD  phenylephrine-shark liver oil-mineral oil-petrolatum (PREPARATION H) 0.25-3-14-71.9 % rectal ointment Place 1 application rectally 2 (two) times daily as needed for hemorrhoids.    Historical Provider, MD  polyethylene glycol (MIRALAX / GLYCOLAX) packet Take 17 g by mouth daily. 1 capful twice a day until stools are soft. 10/11/14   Antonio Severin, MD   BP 127/74 mmHg  Pulse 83  Temp(Src) 98.3 F (36.8 C) (Oral)  Resp 20  SpO2 100% Physical Exam  Constitutional: He is oriented to person, place, and time. He appears well-developed and well-nourished. No distress.  HENT:  Head: Normocephalic and atraumatic.  Eyes: Conjunctivae and EOM are normal.  Neck: Neck supple. No tracheal deviation present.  Cardiovascular: Normal rate.   Pulmonary/Chest: Effort normal. No respiratory distress.  Musculoskeletal: Normal range of motion.  Paronychia to the  radial aspect of the right middle finger. Swelling noted to the distal phalanx, tender to palpation. no palpable Vivia [place]. Full rom of the finger at all joints  Neurological: He is alert and oriented to person, place, and time.  Skin: Skin is warm and dry.  Psychiatric: He has a normal mood and affect. His behavior is normal.  Nursing note and vitals reviewed.   ED Course  Procedures (including critical care time)  DIAGNOSTIC STUDIES: Oxygen Saturation is 100% on RA, normal by my interpretation.    COORDINATION OF CARE: 2:34 PM Discussed treatment plan with pt at bedside and pt agreed to plan.   Labs Review Labs Reviewed - No data to display  Imaging Review No  results found.    EKG Interpretation None      INCISION AND DRAINAGE Performed by: Antonio Salas A Consent: Verbal consent obtained. Risks and benefits: risks, benefits and alternatives were discussed Type: abscess  Body area: right middle figner  Anesthesia: none  Incision was made with a scalpel.   Complexity: complex Blunt dissection to break up loculations  Drainage: purulent  Drainage amount: large, purulent  Packing material:   Patient tolerance: Patient tolerated the procedure well with no immediate complications.    MDM   Final diagnoses:  Paronychia of fifth finger of right hand   Patient with the perinatal To the right middle finger. Incised and drained in the emergency department. Finger soaked in emergency department. At this time no evidence of a felon or tenosynovitis. Will treat with antibiotics, soaks at home, wound care. Follow-up with her primary care doctor. 6 tablets of Norco given for severe pain.  Filed Vitals:   10/01/15 1429  BP: 127/74  Pulse: 83  Temp: 98.3 F (36.8 C)  TempSrc: Oral  Resp: 20  SpO2: 100%     Antonio Crumble, PA-C 10/01/15 1520  Antonio Barrette, MD 10/02/15 [phone]

## 2015-12-31 ENCOUNTER — Encounter (HOSPITAL_COMMUNITY): Payer: Self-pay | Admitting: *Deleted

## 2015-12-31 ENCOUNTER — Emergency Department (HOSPITAL_COMMUNITY)
Admission: EM | Admit: 2015-12-31 | Discharge: 2015-12-31 | Disposition: A | Discharge status: Home / Self Care | Payer: Self-pay | Attending: Emergency Medicine | Admitting: Emergency Medicine

## 2015-12-31 DIAGNOSIS — S46911A Strain of unspecified muscle, fascia and tendon at shoulder and upper arm level, right arm, initial encounter: Secondary | ICD-10-CM | POA: Insufficient documentation

## 2015-12-31 DIAGNOSIS — F17200 Nicotine dependence, unspecified, uncomplicated: Secondary | ICD-10-CM | POA: Insufficient documentation

## 2015-12-31 DIAGNOSIS — S40021A Contusion of right upper arm, initial encounter: Secondary | ICD-10-CM

## 2015-12-31 DIAGNOSIS — Z79891 Long term (current) use of opiate analgesic: Secondary | ICD-10-CM | POA: Insufficient documentation

## 2015-12-31 DIAGNOSIS — Y999 Unspecified external cause status: Secondary | ICD-10-CM | POA: Insufficient documentation

## 2015-12-31 DIAGNOSIS — Y92410 Unspecified street and highway as the place of occurrence of the external cause: Secondary | ICD-10-CM | POA: Insufficient documentation

## 2015-12-31 DIAGNOSIS — Y939 Activity, unspecified: Secondary | ICD-10-CM | POA: Insufficient documentation

## 2015-12-31 DIAGNOSIS — T148XXA Other injury of unspecified body region, initial encounter: Secondary | ICD-10-CM

## 2015-12-31 DIAGNOSIS — Z792 Long term (current) use of antibiotics: Secondary | ICD-10-CM | POA: Insufficient documentation

## 2015-12-31 MED ORDER — HYDROCODONE-ACETAMINOPHEN 5-325 MG PO TABS: 1.0000 | ORAL_TABLET | Freq: Four times a day (QID) | ORAL | Status: AC | PRN

## 2015-12-31 MED ORDER — IBUPROFEN 600 MG PO TABS: 600.0000 mg | ORAL_TABLET | Freq: Four times a day (QID) | ORAL | Status: AC | PRN

## 2015-12-31 MED ORDER — METHOCARBAMOL 500 MG PO TABS: 500.0000 mg | ORAL_TABLET | Freq: Two times a day (BID) | ORAL | Status: AC

## 2015-12-31 MED ORDER — HYDROCODONE-ACETAMINOPHEN 5-325 MG PO TABS
1.0000 | ORAL_TABLET | Freq: Once | ORAL | Status: AC
  Administered 2015-12-31: 1 via ORAL
  Filled 2015-12-31: qty 1

## 2015-12-31 MED ORDER — IBUPROFEN 200 MG PO TABS
600.0000 mg | ORAL_TABLET | Freq: Once | ORAL | Status: AC
  Administered 2015-12-31: 600 mg via ORAL
  Filled 2015-12-31: qty 3
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
## 2016-04-23 ENCOUNTER — Encounter (HOSPITAL_COMMUNITY): Payer: Self-pay | Admitting: *Deleted

## 2016-04-23 ENCOUNTER — Emergency Department (HOSPITAL_COMMUNITY): Payer: Self-pay

## 2016-04-23 ENCOUNTER — Emergency Department (HOSPITAL_COMMUNITY)
Admission: EM | Admit: 2016-04-23 | Discharge: 2016-04-23 | Disposition: A | Discharge status: Home / Self Care | Payer: Self-pay | Attending: Emergency Medicine | Admitting: Emergency Medicine

## 2016-04-23 DIAGNOSIS — S61411A Laceration without foreign body of right hand, initial encounter: Secondary | ICD-10-CM | POA: Insufficient documentation

## 2016-04-23 DIAGNOSIS — S0181XA Laceration without foreign body of other part of head, initial encounter: Secondary | ICD-10-CM | POA: Insufficient documentation

## 2016-04-23 DIAGNOSIS — Y939 Activity, unspecified: Secondary | ICD-10-CM | POA: Insufficient documentation

## 2016-04-23 DIAGNOSIS — S41111A Laceration without foreign body of right upper arm, initial encounter: Secondary | ICD-10-CM | POA: Insufficient documentation

## 2016-04-23 DIAGNOSIS — F17200 Nicotine dependence, unspecified, uncomplicated: Secondary | ICD-10-CM | POA: Insufficient documentation

## 2016-04-23 DIAGNOSIS — Z23 Encounter for immunization: Secondary | ICD-10-CM | POA: Insufficient documentation

## 2016-04-23 DIAGNOSIS — Y999 Unspecified external cause status: Secondary | ICD-10-CM | POA: Insufficient documentation

## 2016-04-23 DIAGNOSIS — Y929 Unspecified place or not applicable: Secondary | ICD-10-CM | POA: Insufficient documentation

## 2016-04-23 DIAGNOSIS — IMO0002 Reserved for concepts with insufficient information to code with codable children: Secondary | ICD-10-CM

## 2016-04-23 MED ORDER — TETANUS-DIPHTH-ACELL PERTUSSIS 5-2.5-18.5 LF-MCG/0.5 IM SUSP
0.5000 mL | Freq: Once | INTRAMUSCULAR | Status: AC
  Administered 2016-04-23: 0.5 mL via INTRAMUSCULAR
  Filled 2016-04-23: qty 0.5

## 2016-04-23 MED ORDER — LIDOCAINE HCL 2 % IJ SOLN
15.0000 mL | Freq: Once | INTRAMUSCULAR | Status: AC
  Administered 2016-04-23: 20 mg
  Filled 2016-04-23: qty 20

## 2016-04-23 MED ORDER — BACITRACIN ZINC 500 UNIT/GM EX OINT
TOPICAL_OINTMENT | Freq: Once | CUTANEOUS | Status: AC
  Administered 2016-04-23: 1 via TOPICAL

## 2016-04-23 NOTE — ED Notes (Signed)
Dressing to right hand with Bacitracin, band aid to right neck and steri strips to right upper arm

## 2016-04-23 NOTE — ED Provider Notes (Signed)
  Physical Exam  Pulse 87   Temp 98.2 F (36.8 C) (Oral)   Resp 18   Ht 5\' 11"  (1.803 m)   Wt 88.9 kg   SpO2 99%   BMI 27.34 kg/m   Physical Exam  ED Course  .Marland Kitchen.Laceration Repair Date/Time: 04/23/2016 1:38 AM Performed by: Wynetta EmeryPISCIOTTA, Omega Slager Authorized by: Wynetta EmeryPISCIOTTA, Imonie Tuch   Consent:    Consent obtained:  Verbal   Consent given by:  Patient Anesthesia (see MAR for exact dosages):    Anesthesia method:  Local infiltration   Local anesthetic:  Lidocaine 1% w/o epi Laceration details:    Location:  Hand   Hand location:  R palm Repair type:    Repair type:  Simple Pre-procedure details:    Preparation:  Patient was prepped and draped in usual sterile fashion Exploration:    Wound exploration: wound explored through full range of motion and entire depth of wound probed and visualized     Contaminated: no   Treatment:    Area cleansed with:  Saline   Amount of cleaning:  Standard   Irrigation solution:  Sterile water   Irrigation volume:  500cc   Irrigation method:  Pressure wash and syringe   Visualized foreign bodies/material removed: no   Skin repair:    Repair method:  Sutures   Suture size:  4-0   Wound skin closure material used: ethylon.   Suture technique:  Running locked   Number of sutures:  7 Approximation:    Approximation:  Close   Vermilion border: well-aligned   Post-procedure details:    Dressing:  Antibiotic ointment   Patient tolerance of procedure:  Tolerated well, no immediate complications .Marland Kitchen.Laceration Repair Date/Time: 04/23/2016 1:40 AM Performed by: Wynetta EmeryPISCIOTTA, Quy Lotts Authorized by: Wynetta EmeryPISCIOTTA, Kalei Mckillop   Consent:    Consent obtained:  Verbal   Consent given by:  Patient Laceration details:    Location:  Shoulder/arm   Length (cm):  5 Repair type:    Repair type:  Simple Pre-procedure details:    Preparation:  Patient was prepped and draped in usual sterile fashion Exploration:    Contaminated: no   Treatment:    Area cleansed with:   Saline   Amount of cleaning:  Standard   Irrigation solution:  Sterile water   Irrigation volume:  250 cc   Irrigation method:  Syringe   Visualized foreign bodies/material removed: no   Skin repair:    Repair method:  Steri-Strips   Number of Steri-Strips:  3 Approximation:    Approximation:  Close   Vermilion border: well-aligned   Post-procedure details:    Patient tolerance of procedure:  Tolerated well, no immediate complications        Wynetta Emeryicole Jayceion Lisenby, PA-C 04/23/16 0140    April Palumbo, MD 04/23/16 0145
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
## 2016-04-23 NOTE — ED Notes (Signed)
Discharge instructions reviewed - voiced understanding 
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
